# Patient Record
Sex: Male | Born: 1980 | Race: White | Hispanic: No | Marital: Married | Smoking: Never smoker
Health system: Southern US, Academic
[De-identification: ages and names within clinical notes are randomized; demographics above are authoritative.]

## PROBLEM LIST (undated history)

## (undated) DIAGNOSIS — D696 Thrombocytopenia, unspecified: Secondary | ICD-10-CM

## (undated) DIAGNOSIS — I10 Essential (primary) hypertension: Secondary | ICD-10-CM

## (undated) DIAGNOSIS — E785 Hyperlipidemia, unspecified: Secondary | ICD-10-CM

## (undated) HISTORY — DX: Thrombocytopenia, unspecified (CMS HCC): D69.6

## (undated) HISTORY — DX: Hyperlipidemia, unspecified: E78.5

## (undated) HISTORY — DX: Essential (primary) hypertension: I10

---
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
## 2022-02-01 IMAGING — MR MRI CERVICAL SPINE WITHOUT CONTRAST
4 of 5 series · 24 of 48 positions shown · IV contrast (gadolinium)
Comparison: None available.

﻿EXAM:  [id number]   MRI CERVICAL SPINE WITHOUT CONTRAST
INDICATION: Neck pain with right upper extremity radiculopathy.
TECHNIQUE: Multiplanar multisequential MRI of the cervical spine was performed without gadolinium contrast.

[Series 5: T2 · sagittal · 3.0mm · 0.62mm/px · 8 of 15 slices shown (1 of 2)]
[im 1/15]
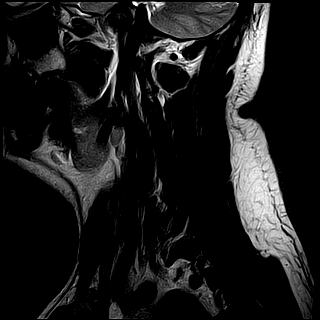
[im 3/15]
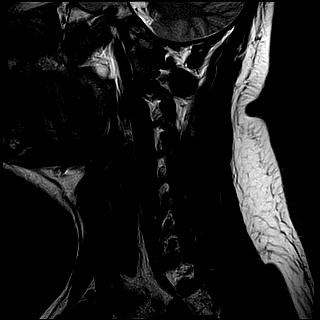
[im 5/15]
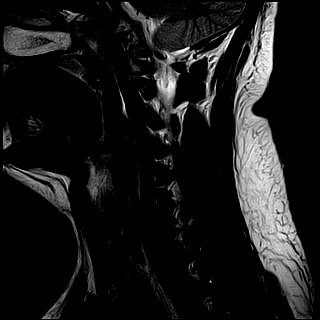
[im 7/15]
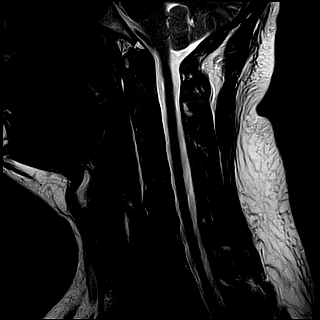
[im 9/15]
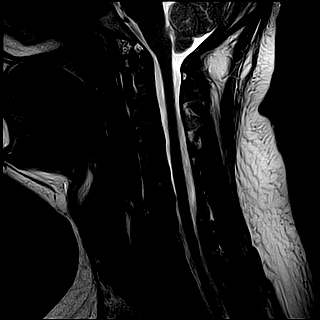
[im 11/15]
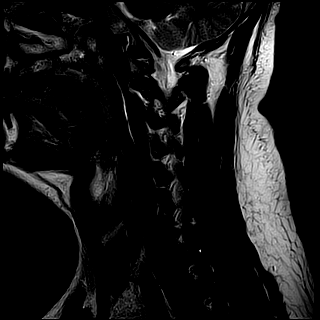
[im 13/15]
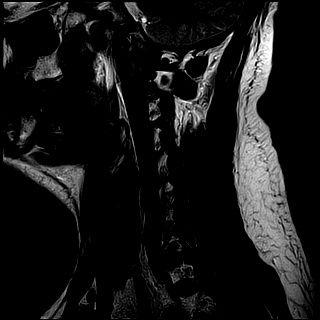
[im 15/15]
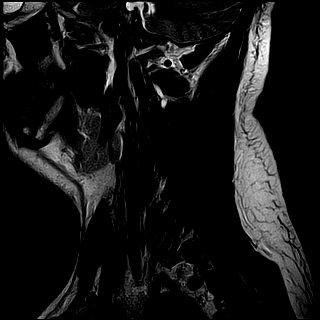

[Series 6: T1 · sagittal · 3.0mm · 0.47mm/px · 3 of 15 slices shown]
[im 2/15]
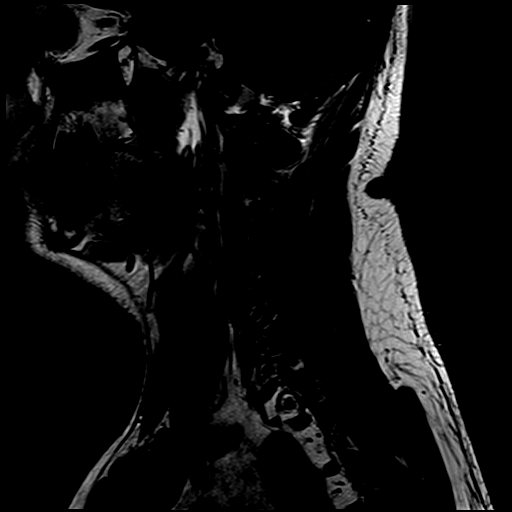
[im 8/15]
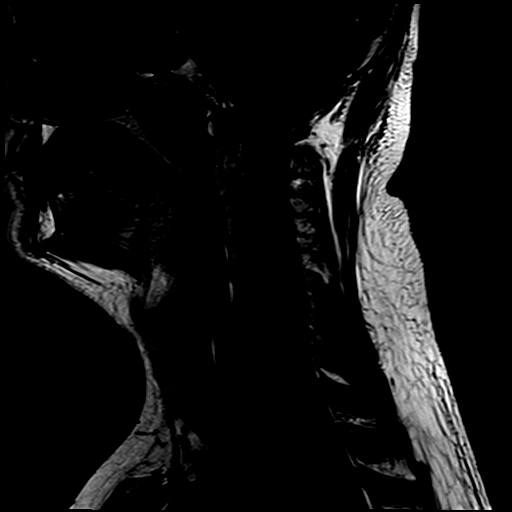
[im 13/15]
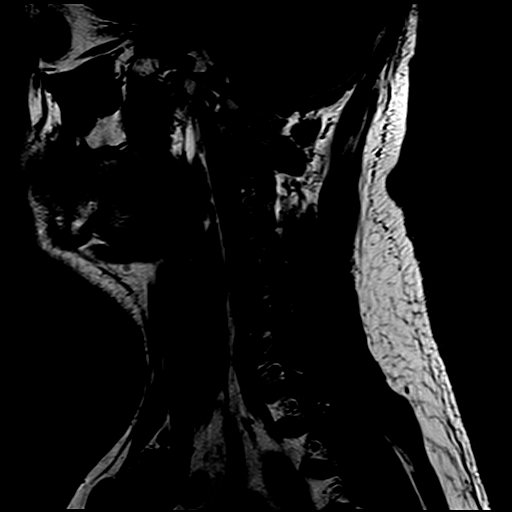

[Series 7: STIR · sagittal · 3.0mm · 0.39mm/px · 3 of 15 slices shown]
[im 2/15]
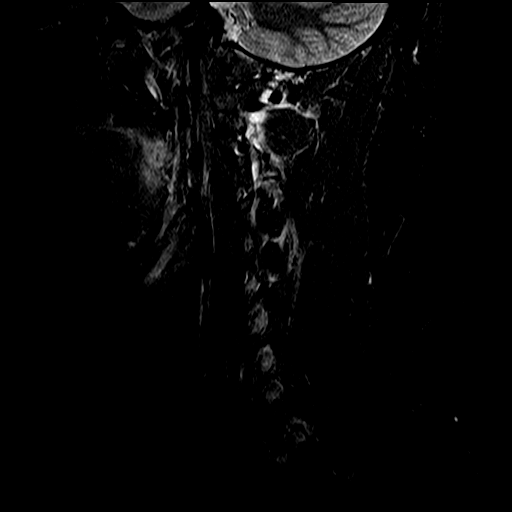
[im 8/15]
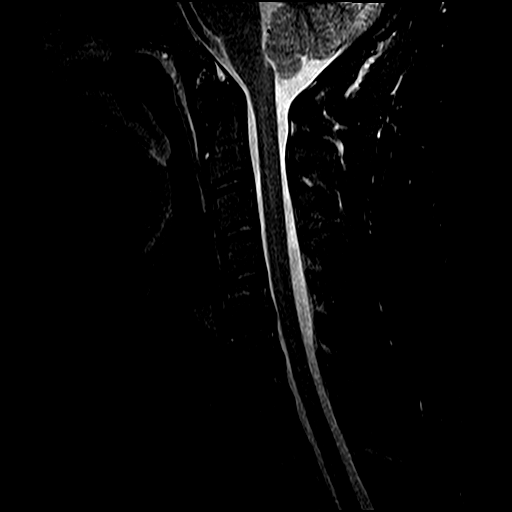
[im 13/15]
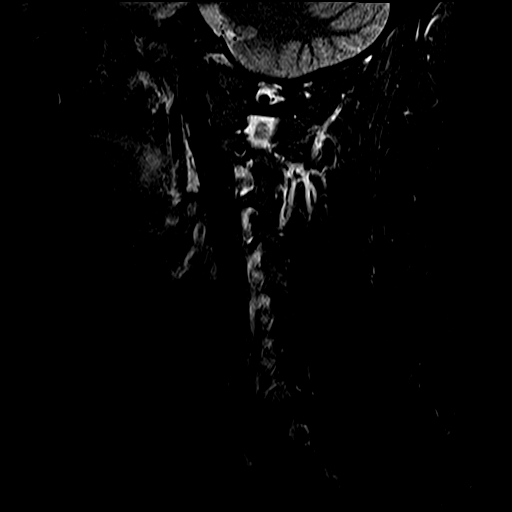

[Series 8: T2 · axial · 3.0mm · 0.39mm/px · z∈[-81,+13]mm · 10 of 18 slices shown (2 of 2)]
[im 1/18]
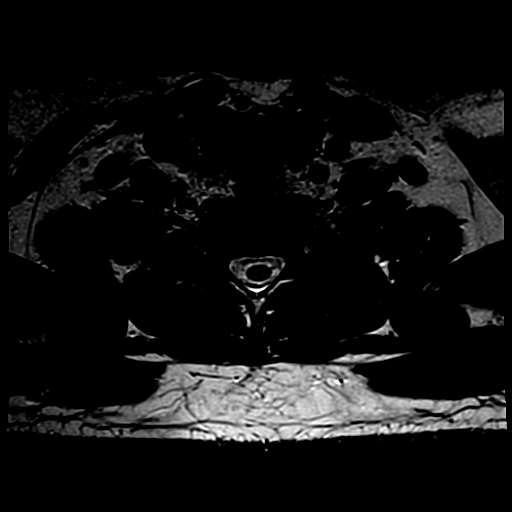
[im 2/18]
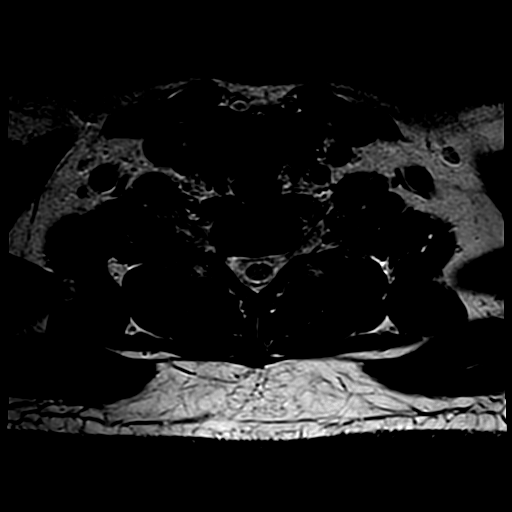
[im 4/18]
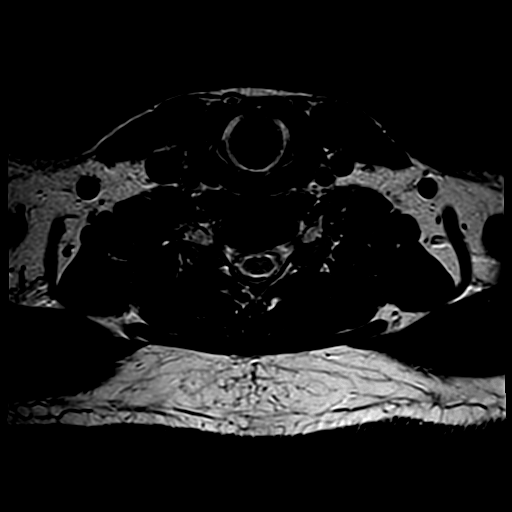
[im 6/18]
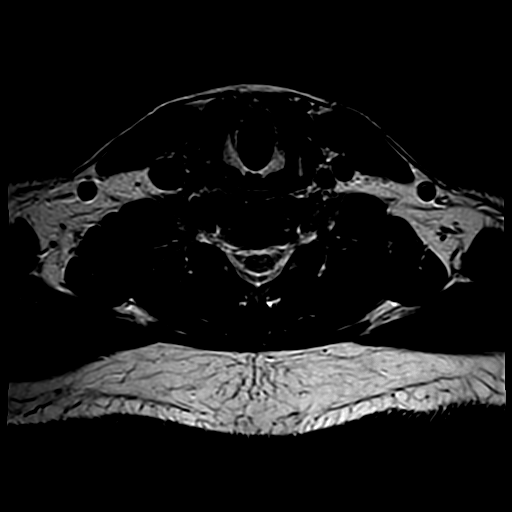
[im 7/18]
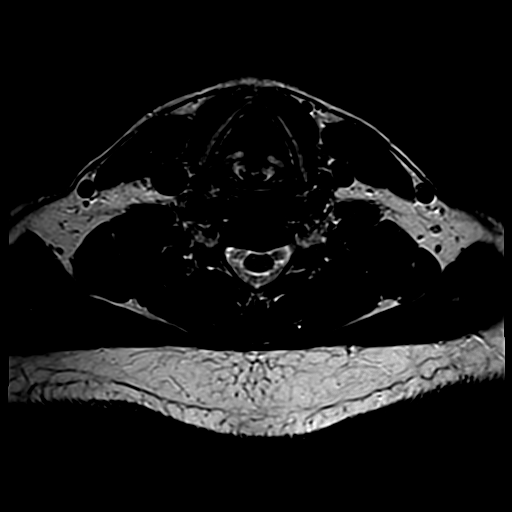
[im 9/18]
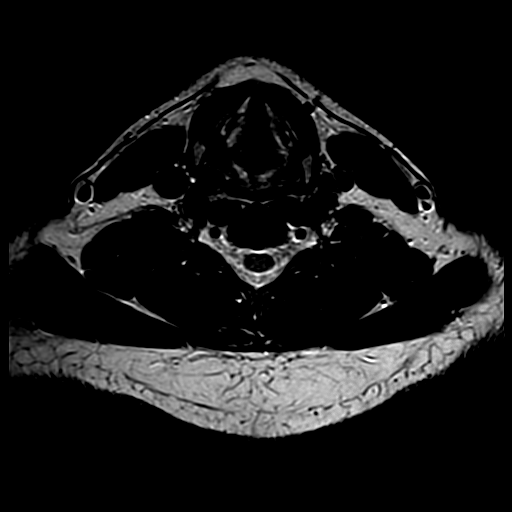
[im 11/18]
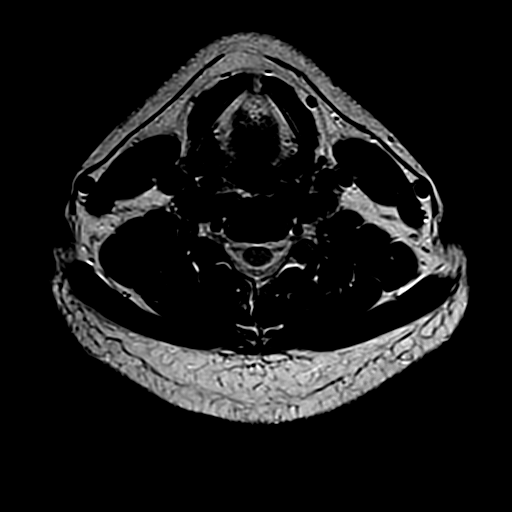
[im 12/18]
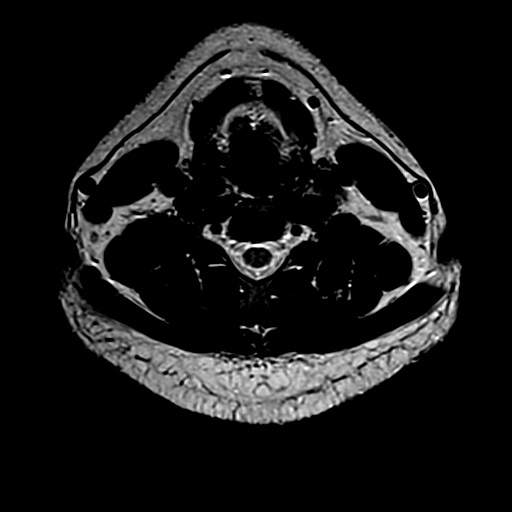
[im 14/18]
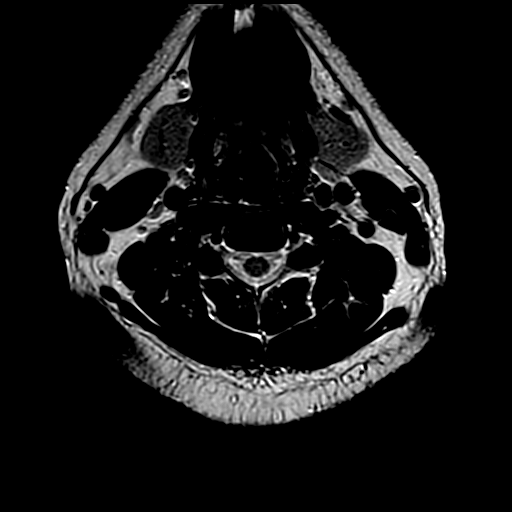
[im 16/18]
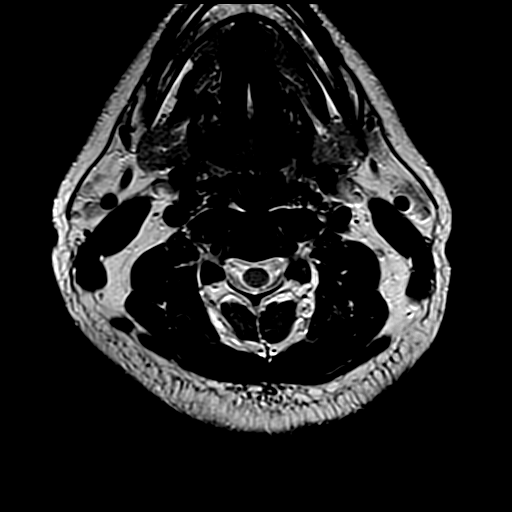

[24 of 48 positions shown; findings below may reference images not displayed]

FINDINGS: Mild straightening of cervical lordosis is likely positional.  Bone marrow signal intensity is normal. There is no acute fracture or subluxation. Visualized spinal cord is normal in signal intensity without evidence of compression at any level. 

C2-3, C3-4 and C4-5 levels are unremarkable. 

At C5-6 level, there is a minimal bulging annulus, minimally effacing the ventral CSF. There is no significant neural foraminal stenosis. 

At C6-7 level, there is a minimal bulging annulus, minimally effacing the ventral CSF. There is mild right neural foraminal stenosis from uncovertebral joint hypertrophy. 

At C7-T1 level, there is a minimal bulging annulus, minimally effacing the ventral CSF. There is no significant neural foraminal stenosis. 

Paraspinal soft tissues are unremarkable.
IMPRESSION: No significant degenerative changes as detailed above.

## 2022-04-18 ENCOUNTER — Ambulatory Visit (INDEPENDENT_AMBULATORY_CARE_PROVIDER_SITE_OTHER): Payer: Self-pay | Admitting: Neurological Surgery

## 2022-04-18 NOTE — Telephone Encounter (Signed)
I called and left message for pt in regards to appt date and time and also to bring MRI disc and reports with  him when he comes to appt. [place] 04-18-22

## 2022-07-12 ENCOUNTER — Ambulatory Visit (INDEPENDENT_AMBULATORY_CARE_PROVIDER_SITE_OTHER): Payer: Medicaid Other | Admitting: Neurological Surgery

## 2022-07-20 ENCOUNTER — Ambulatory Visit (INDEPENDENT_AMBULATORY_CARE_PROVIDER_SITE_OTHER): Payer: Medicaid Other | Admitting: NURSE PRACTITIONER

## 2022-08-28 NOTE — Progress Notes (Deleted)
NEUROSURGERY, PHYSICIAN OFFICE CENTER  [address]  [place] [address]  Operated by [facility]  History and Physical     Name: Andrew Bender MRN:  [id number]   Date: 08/31/2022 Age: 41 y.o.       Referring Provider:  Britt Boozer, MD  [address]  [place],  [place] [address]       Gender: male  Handedness: {NS HANDEDNESS (AMB):[phone]:x}  Marital Status: {:18919:x}   Job Title (or Former Job): ***        Chief Complaint: No chief complaint on file.    History is provided by {HISTORY PROVIDED BY:19225::"patient"}    History of Present Illness  Andrew Bender is a 41 year old male     Past History  No current outpatient medications on file.     Not on File  No past medical history on file.  {Medical History Negative:25850}    {Past Surgical History Negative:25851}      Family History  Family Medical History:    None         Social History  Social History     Socioeconomic History    Marital status: Married     Review of Systems  {Ros - complete:30496}    Examination  There were no vitals taken for this visit.        Constitutional  General appearance: {NORMAL/ABNORMAL:20180}  HNNT: {NORMAL/ABNORMAL:20180}  Eyes: Ophthalmic exam of optic discs and posterior segments: {NORMAL/ABNORMAL:20180}  Cardiovascular:   Carotid arteries: {NORMAL/ABNORMAL:20180}  Auscultation: {NORMAL/ABNORMAL:20180}  Peripheral vascular system: {NORMAL/ABNORMAL:20180}  Musculoskeletal  Gait and Station: : {NORMAL/ABNORMAL:20180}  Muscle strength (upper extremities): : {NORMAL/ABNORMAL:20180}  Muscle strength (lower extremities): : {NORMAL/ABNORMAL:20180}  Muscle tone (upper extremities): : {NORMAL/ABNORMAL:20180}  Muscle tone (lower extremities): : {NORMAL/ABNORMAL:20180}  Sensation: {NORMAL/ABNORMAL:20180}  Deep tendon reflexes upper and lower extremities: {NORMAL/ABNORMAL:20180}  Coordination: {NORMAL/ABNORMAL:20180}  Hoffman's reflex: Left: {Pos/neg/not tested:13089} Right: {Pos/neg/not tested:13089}  Ankle clonus:Left :  {ANKLE CLONUS:20024204} Right {ANKLE CLONUS:20024204}  Babinski: Left: {ABSENT/PRESENT:21454} Right: {ABSENT/PRESENT:21454  Straight leg raise: Left: {POS/NEG (AMB):19223} Right: {POS/NEG (AMB):19223}  Crossed Straight leg raise: Left: {POS/NEG (AMB):19223} Right: {POS/NEG (AMB):19223}  Spurling's test: Left: {POS/NEG (AMB):19223} Right: {POS/NEG (AMB):19223}  Patrick's sign: Left: {POS/NEG (AMB):19223} Right: {POS/NEG (AMB):19223}  Musculoskeletal tenderness: {POS/NEG (AMB):19223}    Neurological  Orientation: {NORMAL/ABNORMAL:20180}  Recent and remote memory: {NORMAL/ABNORMAL:20180}  Attention span and concentration: {NORMAL/ABNORMAL:20180}  Language: {NORMAL/ABNORMAL:20180}  Fund of knowledge: {NORMAL/ABNORMAL:20180}  Cranial Nerves  2nd: {NORMAL/ABNORMAL:20180}  3rd,4th,6th: {NORMAL/ABNORMAL:20180}  5th: {NORMAL/ABNORMAL:20180}  7th: {NORMAL/ABNORMAL:20180}  8th: {NORMAL/ABNORMAL:20180}  9th: {NORMAL/ABNORMAL:20180}  11th: {NORMAL/ABNORMAL:20180}  12th: {NORMAL/ABNORMAL:20180}      Data reviewed  {IMAGESREVIEWED:24201}    Discussions with other providers:     Diagnosis  No diagnosis found.    Recommendations  No orders of the defined types were placed in this encounter.      Terrilyn Saver, APRN,FNP-BC

## 2022-08-31 ENCOUNTER — Ambulatory Visit (INDEPENDENT_AMBULATORY_CARE_PROVIDER_SITE_OTHER): Payer: Medicaid Other | Admitting: NURSE PRACTITIONER

## 2022-09-13 ENCOUNTER — Ambulatory Visit (INDEPENDENT_AMBULATORY_CARE_PROVIDER_SITE_OTHER): Payer: Medicaid Other | Admitting: Neurological Surgery

## 2022-10-16 ENCOUNTER — Ambulatory Visit: Payer: Medicaid Other | Attending: Neurological Surgery | Admitting: Neurological Surgery

## 2022-10-16 ENCOUNTER — Other Ambulatory Visit: Payer: Self-pay

## 2022-10-16 ENCOUNTER — Encounter (INDEPENDENT_AMBULATORY_CARE_PROVIDER_SITE_OTHER): Payer: Self-pay | Admitting: Neurological Surgery

## 2022-10-16 VITALS — BP 121/84 | HR 58 | Temp 96.9°F | Ht 69.0 in | Wt 241.4 lb

## 2022-10-16 DIAGNOSIS — M4302 Spondylolysis, cervical region: Secondary | ICD-10-CM

## 2022-10-16 DIAGNOSIS — M5030 Other cervical disc degeneration, unspecified cervical region: Secondary | ICD-10-CM | POA: Insufficient documentation

## 2022-10-16 NOTE — Progress Notes (Signed)
NEUROSURGERY, PHYSICIAN OFFICE CENTER  [place] [address]  Operated by [facility]  History and Physical     Name: Andrew Bender MRN:  [id number]   Date: 10/16/2022 Age: 42 y.o.       Referring Provider:  Raynelle Jan, MD  [address],  [place] [address]       Gender: male  Handedness: Right handed  Marital Status: Married   Job Title (or Former Job): temporary disability, Contractor Complaint:   Chief Complaint   Patient presents with    Cervical Disc Degeneration     H/c MRI on disc      History is provided by patient    History of Present Illness  This is a 42 yo male with constant neck pain and intermittent paresthesias in the hands that have been present since 2018/2019.   He has "popping" in the neck and feel the need to stretch.    His pain is problematic with trying to sleep (numbness in hands), writing, driving, sitting and sleeping. He denies weakness in the hands.  He feels this has improved since he hasn't been working. He used to have so much pain in his hands that he dropped things.  He denies fine motor skill or balance issues.    [facility]- neurology -EMG recently showed right greater then left cts.  [facility] - offered ctr - [facility].  He denies cervical surgery or fractures.    He attempted PT in 2019 but it made the pain worse and caused difficulty driving.  He had acupuncture which affected his balance.  He believes he had an injection in 2019 which did not help.  Past History  Current Outpatient Medications   Medication Sig    celecoxib (CELEBREX) 200 mg Oral Capsule     cetirizine (ZYRTEC) 10 mg Oral Tablet     Ibuprofen (MOTRIN) 800 mg Oral Tablet Take 1 Tablet (800 mg total) by mouth Three times a day as needed for Pain    lisinopriL (PRINIVIL) 20 mg Oral Tablet     rosuvastatin (CRESTOR) 10 mg Oral Tablet      No Known Allergies  Past Medical History:   Diagnosis Date    HTN (hypertension)     Hyperlipidemia     Thrombocytopenia,  unspecified (CMS HCC)      Past Medical History Pertinent Negatives:   Diagnosis Date Noted    Coronary artery disease 10/16/2022    Diabetes mellitus, type 2 (CMS HCC) 10/16/2022    Stroke (CMS [facility] [place]) 10/16/2022      No past surgical history on file.       Family History  Family Medical History:    None         Social History  Social History     Socioeconomic History    Marital status: Married   Tobacco Use    Smoking status: Never     Passive exposure: Never    Smokeless tobacco: Never   Substance and Sexual Activity    Alcohol use: Never    Drug use: Never     Review of Systems  Other than ROS in the HPI, all other systems were negative.    Examination  BP 121/84   Pulse 58   Temp 36.1 C (96.9 F) (Thermal Scan)   Ht 1.753 m (5\' 9" )   Wt 110 kg (241 lb 6.5 oz)   SpO2 96%   BMI 35.65  kg/m     Constitutional  General appearance: Normal  HNNT: Normal  Eyes: Ophthalmic exam of optic discs and posterior segments: Normal  Cardiovascular:   Peripheral vascular system: Normal  Musculoskeletal  Gait and Station: : Normal  Muscle strength (upper extremities): : Normal  Muscle strength (lower extremities): : Normal  Muscle tone (upper extremities): : Normal  Muscle tone (lower extremities): : Normal  Sensation: Normal  Deep tendon reflexes upper and lower extremities: Normal  Coordination: Normal  Hoffman's reflex: Left: negative Right: negative  Ankle clonus:Left : Not present Right Not present  Babinski: Left: absent Right: absent    Neurological  Orientation: Normal  Recent and remote memory: Normal  Attention span and concentration: Normal  Language: Normal  Fund of knowledge: Normal  Cranial Nerves  2nd: Normal  3rd,4th,6th: Normal  5th: Normal  7th: Normal  8th: Normal  9th: Normal  11th: Normal  12th: Normal      Data reviewed  1. MRI of the Cervical Spine performed on 01/2022 on [place] and it shows mild deg changes without cord compression or significant nerve root compression.     Discussions with other  providers:     Diagnosis  Assessment/Plan   1. DDD (degenerative disc disease), cervical        Recommendations  No orders of the defined types were placed in this encounter.    Recommend carpal tunnel release once thrombocytopenia is worked up. Nonsurgical.    The patient was seen as a shared visit with the co-signing faculty.   Barrie Folk, PA-C    I personally saw and evaluated the patient as part of a shared service with an APP.    My substantive findings are:  MDM (complete) cervical spondylosis, plan as above    I independently of the APP spent a total of (30) minutes in direct/indirect care of this patient including initial evaluation, review of laboratory, radiology, diagnostic studies, review of medical record, order entry and coordination of care.

## 2023-10-05 IMAGING — MR MRI CERVICAL SPINE WITHOUT CONTRAST
4 of 5 series · 23 of 48 positions shown · IV contrast (gadolinium)
Comparison: MRI dated 02/01/2022.

﻿EXAM:  [id number]   MRI CERVICAL SPINE WITHOUT CONTRAST
INDICATION: Worsening neck pain with radicular symptoms to the right shoulder and upper extremity with tingling and numbness.  No history of C-spine surgery.
TECHNIQUE: Multiplanar, multisequential MRI of the C-spine was performed without gadolinium contrast.

[Series 7: STIR · sagittal · 3.0mm · 0.47mm/px · 3 of 13 slices shown]
[im 2/13]
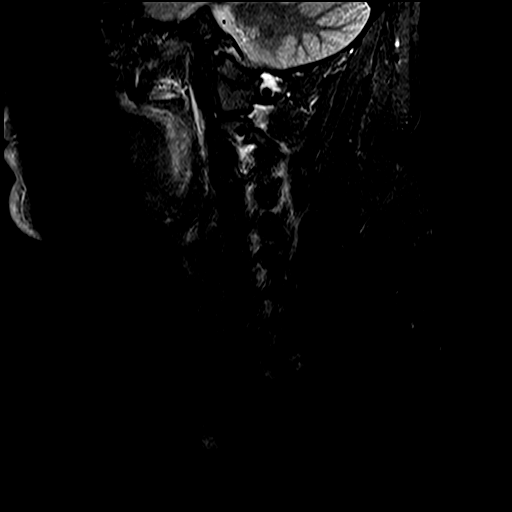
[im 7/13]
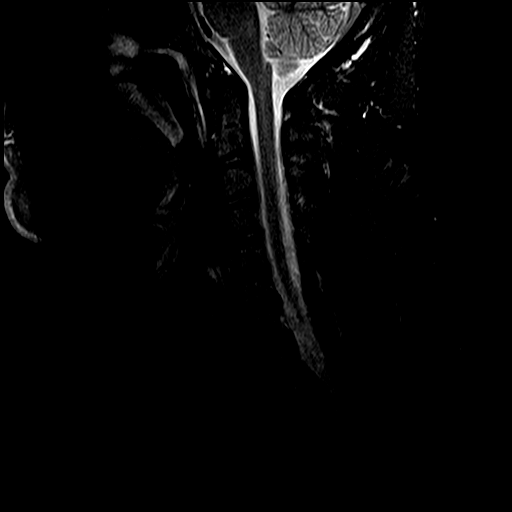
[im 11/13]
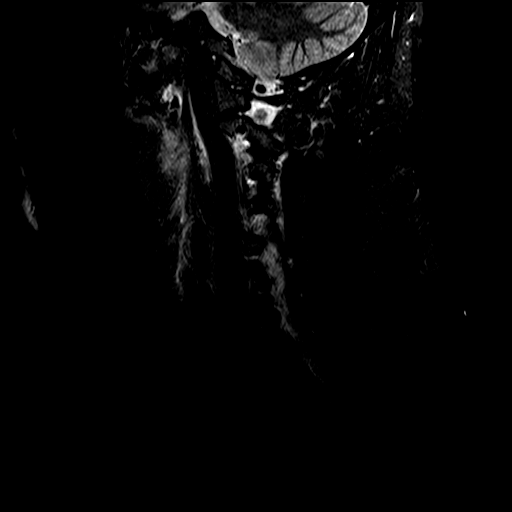

[Series 8: T2 · axial · 3.0mm · 0.39mm/px · z∈[-104,-2]mm · 9 of 18 slices shown (1 of 2)]
[im 1/18]
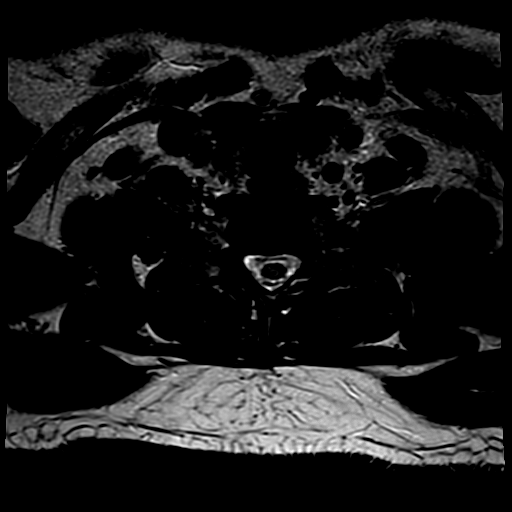
[im 4/18]
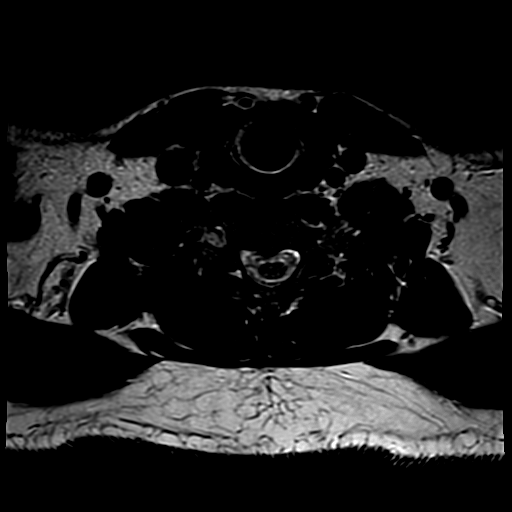
[im 5/18]
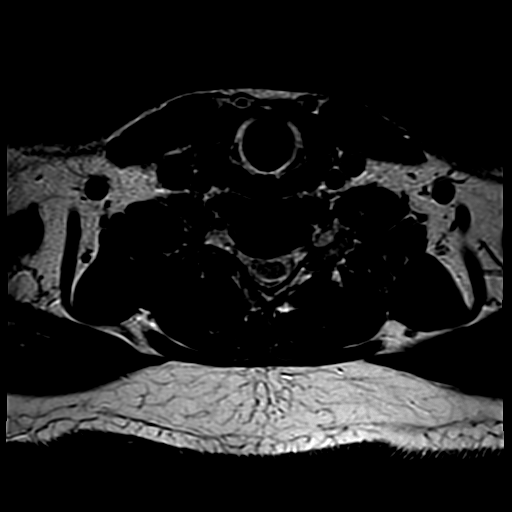
[im 8/18]
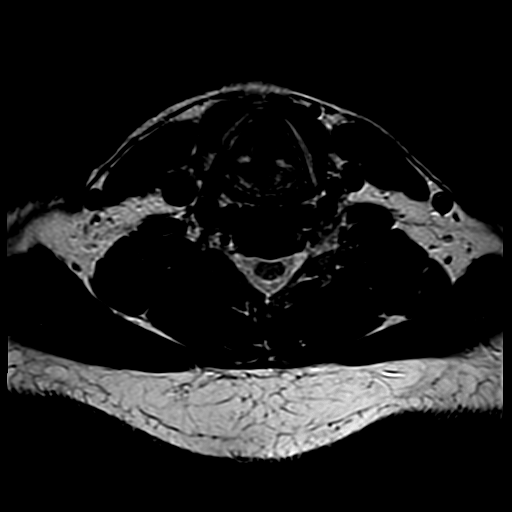
[im 10/18]
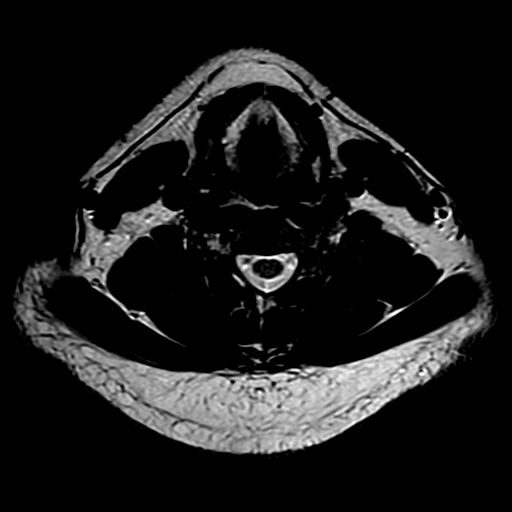
[im 13/18]
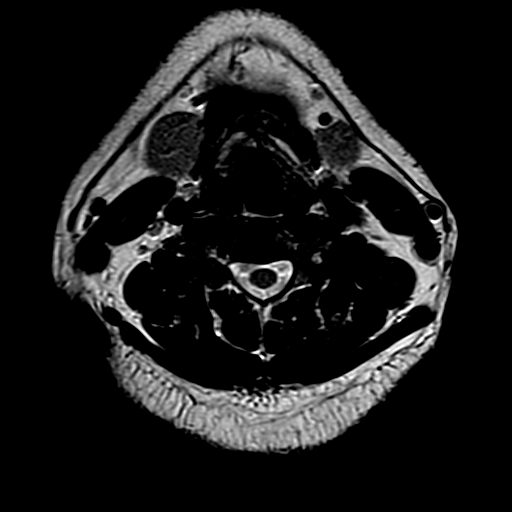
[im 14/18]
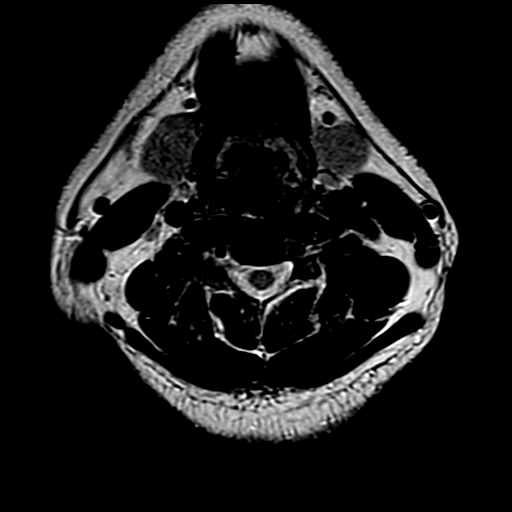
[im 16/18]
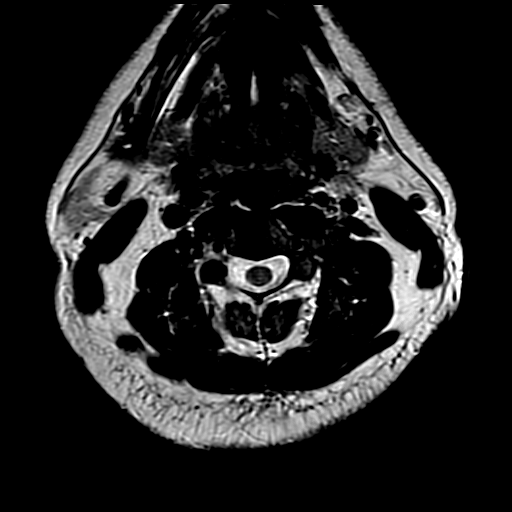
[im 18/18]
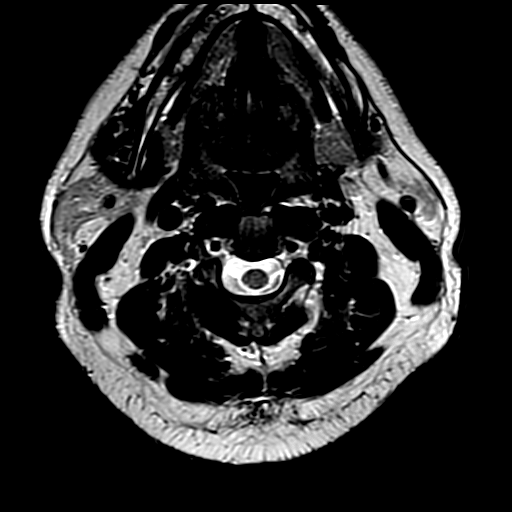

[Series 10: T2 · sagittal · 3.0mm · 0.75mm/px · 8 of 13 slices shown (2 of 2)]
[im 1/13]
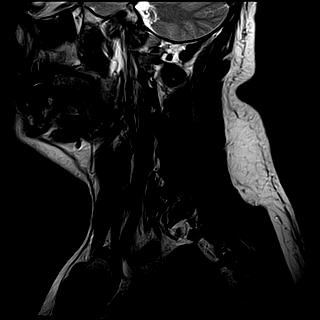
[im 2/13]
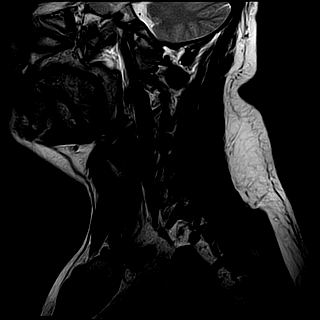
[im 4/13]
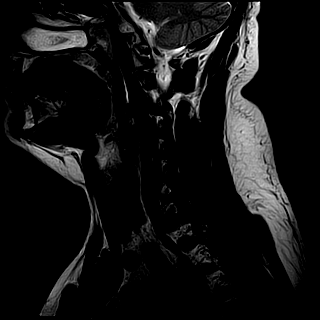
[im 6/13]
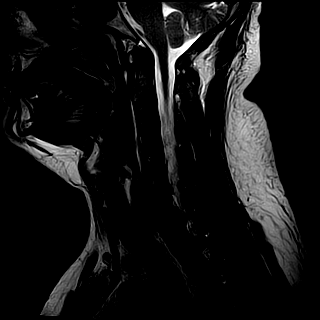
[im 7/13]
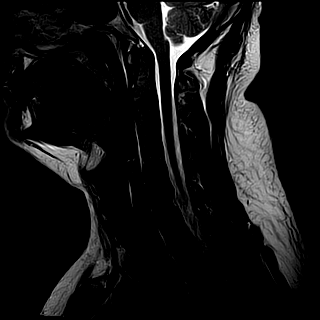
[im 9/13]
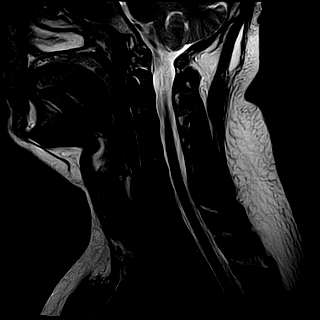
[im 11/13]
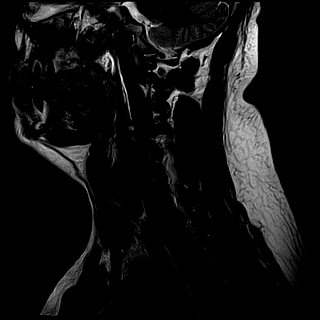
[im 13/13]
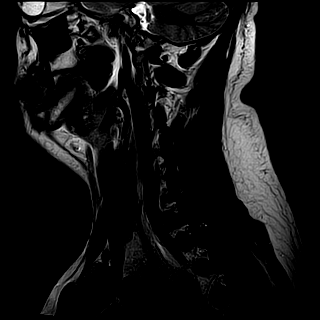

[Series 11: T1 · sagittal · 3.0mm · 0.47mm/px · 3 of 13 slices shown]
[im 2/13]
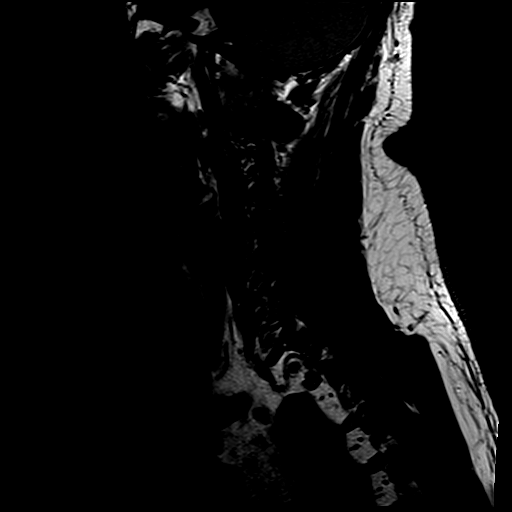
[im 7/13]
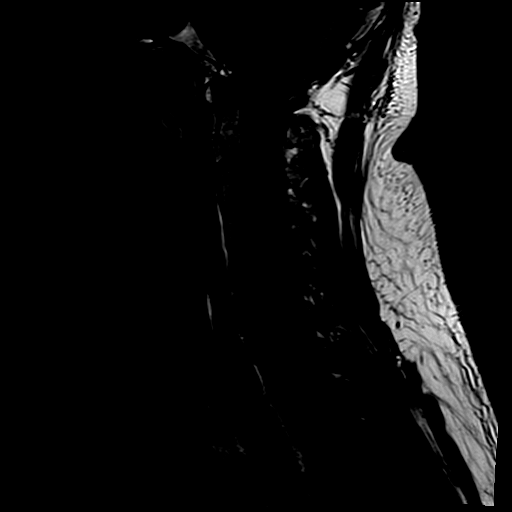
[im 11/13]
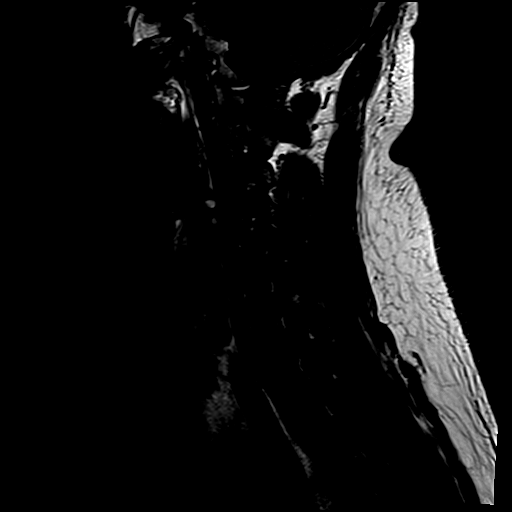

[23 of 48 positions shown; findings below may reference images not displayed]

FINDINGS: No acute bony lesions of cervical vertebrae are seen. Foramen magnum structures are normal in the sagittal view. 

At C2-3 level, no focal disc lesions.  C3-4 disc is normal. C4-5 disc is normal.

At C5-6 level, no significant focal disc lesions. Mild degenerative changes with osteophyte complex on the left side is causing minimal compromise of left foramen. 

At C6-7 level, degenerative disc disease with asymmetric bulging annulus to the right is causing mild compromise of right lateral recess and neural foramen. Mild narrowing of thecal sac in the midline with AP diameter measuring 9 mm. 

C7-T1 disc shows no focal abnormalities.  Cervical spinal cord shows no focal abnormalities.
IMPRESSION: 1. No acute or focal bone changes of cervical vertebrae. 

2. At C6-7 level, degenerative disc disease with asymmetric bulging annulus to the right is causing mild compromise of right lateral recess and neural foramen. Mild narrowing of thecal sac in the midline with AP diameter measuring 9 mm. 

3. Findings at other disc levels are described above in detail.  Cervical spinal cord shows no focal abnormalities.

4. Compared with previous examination, degenerative changes at C6-7 level are slightly more prominent.
